# Patient Record
Sex: Male | Born: 1990 | Race: White | Hispanic: No | Marital: Single | Smoking: Current every day smoker
Health system: Southern US, Community
[De-identification: ages and names within clinical notes are randomized; demographics above are authoritative.]

---

## 2016-10-18 ENCOUNTER — Emergency Department
Admission: EM | Admit: 2016-10-18 | Discharge: 2016-10-18 | Disposition: A | Discharge status: Home / Self Care | Payer: Self-pay | Attending: Emergency Medicine | Admitting: Emergency Medicine

## 2016-10-18 ENCOUNTER — Encounter: Payer: Self-pay | Admitting: Emergency Medicine

## 2016-10-18 ENCOUNTER — Emergency Department: Payer: Self-pay

## 2016-10-18 DIAGNOSIS — F17200 Nicotine dependence, unspecified, uncomplicated: Secondary | ICD-10-CM | POA: Insufficient documentation

## 2016-10-18 DIAGNOSIS — L03011 Cellulitis of right finger: Secondary | ICD-10-CM | POA: Insufficient documentation

## 2016-10-18 DIAGNOSIS — Z23 Encounter for immunization: Secondary | ICD-10-CM | POA: Insufficient documentation

## 2016-10-18 MED ORDER — SULFAMETHOXAZOLE-TRIMETHOPRIM 800-160 MG PO TABS: 1.0000 | ORAL_TABLET | Freq: Two times a day (BID) | ORAL | 0 refills | Status: DC

## 2016-10-18 MED ORDER — TETANUS-DIPHTH-ACELL PERTUSSIS 5-2.5-18.5 LF-MCG/0.5 IM SUSP
INTRAMUSCULAR | Status: AC
  Filled 2016-10-18: qty 0.5

## 2016-10-18 MED ORDER — LIDOCAINE HCL (PF) 1 % IJ SOLN
5.0000 mL | Freq: Once | INTRAMUSCULAR | Status: DC
  Filled 2016-10-18: qty 5

## 2016-10-18 MED ORDER — TETANUS-DIPHTH-ACELL PERTUSSIS 5-2.5-18.5 LF-MCG/0.5 IM SUSP
0.5000 mL | Freq: Once | INTRAMUSCULAR | Status: AC
  Administered 2016-10-18: 0.5 mL via INTRAMUSCULAR
  Filled 2016-10-18: qty 0.5
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
## 2016-10-18 NOTE — ED Notes (Signed)
Supplies at bedside for Dr Derrill Kay

## 2016-10-18 NOTE — ED Provider Notes (Signed)
[facility] Emergency Department Provider Note  ____________________________________________   I have reviewed the triage vital signs and the nursing notes.   HISTORY  Chief Complaint Finger Injury   History limited by: Not Limited   HPI Eric Bell is a 26 y.o. male who presents to the emergency department today because of concerns for right fifth digit pain and swelling. States 3 days ago he injured the tip of his right finger. Since that time it has increased in swelling and pain. The pain does sometimes radiate up his hand and arm. The patient does bite his nails. He denies any fevers, nausea or vomiting.   History reviewed. No pertinent past medical history.  There are no active problems to display for this patient.   History reviewed. No pertinent surgical history.  Prior to Admission medications   Not on File    Allergies Patient has no known allergies.  No family history on file.  Social History Social History  Substance Use Topics  . Smoking status: Current Every Day Smoker    Packs/day: 0.50  . Smokeless tobacco: Never Used  . Alcohol use Yes    Review of Systems  Constitutional: Negative for fever. Cardiovascular: Negative for chest pain. Respiratory: Negative for shortness of breath. Gastrointestinal: Negative for abdominal pain, vomiting and diarrhea. Genitourinary: Negative for dysuria. Musculoskeletal: Positive for right fifth digit pain and swelling. Skin: Swelling to right fifth digit.  Neurological: Negative for headaches, focal weakness or numbness.  10-point ROS otherwise negative.  ____________________________________________   PHYSICAL EXAM:  VITAL SIGNS: ED Triage Vitals  Enc Vitals Group     BP 10/18/16 0310 125/72     Pulse Rate 10/18/16 0310 93     Resp 10/18/16 0310 16     Temp 10/18/16 0310 97.9 F (36.6 C)     Temp Source 10/18/16 0310 Oral     SpO2 10/18/16 0310 97 %     Weight 10/18/16  0310 140 lb (63.5 kg)     Height 10/18/16 0310  (1.651 m)     Head Circumference --      Peak Flow --      Pain Score 10/18/16 0542 6     Pain Loc --      Pain Edu? --      Excl. in GC? --      Constitutional: Alert and oriented. Well appearing and in no distress. Eyes: Conjunctivae are normal. Normal extraocular movements. ENT   Head: Normocephalic and atraumatic.   Nose: No congestion/rhinnorhea.   Mouth/Throat: Mucous membranes are moist.   Neck: No stridor. Cardiovascular: Normal rate, regular rhythm.  No murmurs, rubs, or gallops.  Respiratory: Normal respiratory effort without tachypnea nor retractions. Breath sounds are clear and equal bilaterally. No wheezes/rales/rhonchi. Genitourinary: Deferred Musculoskeletal: right fifth upper extremity digit with paronychia Neurologic:  Normal speech and language. No gross focal neurologic deficits are appreciated.  Skin:  Skin is warm, dry and intact. No rash noted. Psychiatric: Mood and affect are normal. Speech and behavior are normal. Patient exhibits appropriate insight and judgment.  ____________________________________________    LABS (pertinent positives/negatives)  None  ____________________________________________   EKG  None  ____________________________________________    RADIOLOGY  Right 5th digit IMPRESSION: Negative for fracture or dislocation. No radiopaque foreign body.  ____________________________________________   PROCEDURES  Procedures  Incision and Drainage of Abcess Location: right fifth upper extremity digit Anesthesia Finger block: 1% lidocaine without epi  Prep/Procedure: Skin Prep: Chlorahex Incised abscess with #  11 blade Purulent discharge: moderate Estimated blood loss: <1 ml    ____________________________________________   INITIAL IMPRESSION / ASSESSMENT AND PLAN / ED COURSE  Pertinent labs & imaging results that were available during my care of the  patient were reviewed by me and considered in my medical decision making (see chart for details).  Patient presented to the emergency department today because of concerns for swelling and pain to the distal aspect of his right fifth digit. He did state that he injured it 3 days. X-ray did not show any osseous injury. On exam he did have a paronychia. This was incised and drained with a moderate amount of pus. Advised patient and gave instuctions to stop finger biting. Discussed return precautions. Will give patient prescription for antibiotics.  ____________________________________________   FINAL CLINICAL IMPRESSION(S) / ED DIAGNOSES  Final diagnoses:  Paronychia of finger of right hand     Note: This dictation was prepared with Dragon dictation. Any transcriptional errors that result from this process are unintentional     Phineas Semen, MD 10/18/16 [phone]

## 2016-10-18 NOTE — Discharge Instructions (Signed)
Please seek medical attention for any high fevers, chest pain, shortness of breath, change in behavior, persistent vomiting, bloody stool or any other new or concerning symptoms.  
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
## 2017-02-04 ENCOUNTER — Emergency Department
Admission: EM | Admit: 2017-02-04 | Discharge: 2017-02-05 | Disposition: A | Discharge status: Home / Self Care | Payer: Self-pay | Attending: Emergency Medicine | Admitting: Emergency Medicine

## 2017-02-04 ENCOUNTER — Emergency Department: Payer: Self-pay

## 2017-02-04 DIAGNOSIS — Y999 Unspecified external cause status: Secondary | ICD-10-CM | POA: Insufficient documentation

## 2017-02-04 DIAGNOSIS — Y939 Activity, unspecified: Secondary | ICD-10-CM | POA: Insufficient documentation

## 2017-02-04 DIAGNOSIS — Z23 Encounter for immunization: Secondary | ICD-10-CM | POA: Insufficient documentation

## 2017-02-04 DIAGNOSIS — F10920 Alcohol use, unspecified with intoxication, uncomplicated: Secondary | ICD-10-CM | POA: Insufficient documentation

## 2017-02-04 DIAGNOSIS — S01112A Laceration without foreign body of left eyelid and periocular area, initial encounter: Secondary | ICD-10-CM | POA: Insufficient documentation

## 2017-02-04 DIAGNOSIS — T40604A Poisoning by unspecified narcotics, undetermined, initial encounter: Secondary | ICD-10-CM | POA: Insufficient documentation

## 2017-02-04 DIAGNOSIS — Y929 Unspecified place or not applicable: Secondary | ICD-10-CM | POA: Insufficient documentation

## 2017-02-04 DIAGNOSIS — S0990XA Unspecified injury of head, initial encounter: Secondary | ICD-10-CM

## 2017-02-04 LAB — COMPREHENSIVE METABOLIC PANEL
ALBUMIN: 3.9 g/dL (ref 3.5–5.0)
ALT: 13 U/L — ABNORMAL LOW (ref 17–63)
ANION GAP: 7 (ref 5–15)
AST: 19 U/L (ref 15–41)
Alkaline Phosphatase: 41 U/L (ref 38–126)
BILIRUBIN TOTAL: 0.9 mg/dL (ref 0.3–1.2)
BUN: 9 mg/dL (ref 6–20)
CHLORIDE: 110 mmol/L (ref 101–111)
CO2: 26 mmol/L (ref 22–32)
Calcium: 8.5 mg/dL — ABNORMAL LOW (ref 8.9–10.3)
Creatinine, Ser: 1.03 mg/dL (ref 0.61–1.24)
GFR calc Af Amer: >60 mL/min (ref >60)
GFR calc non Af Amer: >60 mL/min (ref >60)
GLUCOSE: 95 mg/dL (ref 65–99)
POTASSIUM: 3.3 mmol/L — AB (ref 3.5–5.1)
SODIUM: 143 mmol/L (ref 135–145)
TOTAL PROTEIN: 6.4 g/dL — AB (ref 6.5–8.1)

## 2017-02-04 LAB — ETHANOL: Alcohol, Ethyl (B): 291 mg/dL — ABNORMAL HIGH (ref <5)

## 2017-02-04 LAB — CBC
HEMATOCRIT: 42.4 % (ref 40.0–52.0)
HEMOGLOBIN: 14.9 g/dL (ref 13.0–18.0)
MCH: 31.5 pg (ref 26.0–34.0)
MCHC: 35.1 g/dL (ref 32.0–36.0)
MCV: 89.8 fL (ref 80.0–100.0)
Platelets: 195 10*3/uL (ref 150–440)
RBC: 4.73 MIL/uL (ref 4.40–5.90)
RDW: 12.9 % (ref 11.5–14.5)
WBC: 6.3 10*3/uL (ref 3.8–10.6)

## 2017-02-04 LAB — ACETAMINOPHEN LEVEL

## 2017-02-04 LAB — SALICYLATE LEVEL: Salicylate Lvl: <7 mg/dL (ref 2.8–30.0)

## 2017-02-04 MED ORDER — TETANUS-DIPHTH-ACELL PERTUSSIS 5-2.5-18.5 LF-MCG/0.5 IM SUSP
0.5000 mL | Freq: Once | INTRAMUSCULAR | Status: AC
  Administered 2017-02-04: 0.5 mL via INTRAMUSCULAR
  Filled 2017-02-04: qty 0.5

## 2017-02-04 NOTE — ED Notes (Signed)
Pt asleep at this time, responsive to painful stimuli, pt repositioned to protect airway

## 2017-02-04 NOTE — ED Notes (Signed)
Pt placed on 2L via [place].  
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
## 2017-02-04 NOTE — ED Notes (Signed)
Pt o2sat drop to 81%, o2 increased to 4L

## 2017-02-04 NOTE — ED Notes (Signed)
Per Dr. Pershing ProudSchaevitz, remove c-collar and remove oxygen, continue to monitor
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
## 2017-02-05 LAB — URINE DRUG SCREEN, QUALITATIVE (ARMC ONLY)
Amphetamines, Ur Screen: NOT DETECTED
BARBITURATES, UR SCREEN: NOT DETECTED
Benzodiazepine, Ur Scrn: POSITIVE — AB
CANNABINOID 50 NG, UR ~~LOC~~: POSITIVE — AB
COCAINE METABOLITE, UR ~~LOC~~: NOT DETECTED
MDMA (Ecstasy)Ur Screen: NOT DETECTED
Methadone Scn, Ur: NOT DETECTED
OPIATE, UR SCREEN: NOT DETECTED
PHENCYCLIDINE (PCP) UR S: NOT DETECTED
Tricyclic, Ur Screen: NOT DETECTED

## 2017-02-05 LAB — TROPONIN I: Troponin I: <0.03 ng/mL (ref <0.03)

## 2017-02-05 MED ORDER — HALOPERIDOL LACTATE 5 MG/ML IJ SOLN
5.0000 mg | Freq: Once | INTRAMUSCULAR | Status: AC
  Administered 2017-02-05: 5 mg via INTRAVENOUS

## 2017-02-05 MED ORDER — DIPHENHYDRAMINE HCL 50 MG/ML IJ SOLN
50.0000 mg | Freq: Once | INTRAMUSCULAR | Status: AC
  Administered 2017-02-05: 50 mg via INTRAVENOUS

## 2017-02-05 MED ORDER — DIPHENHYDRAMINE HCL 50 MG/ML IJ SOLN
INTRAMUSCULAR | Status: AC
  Filled 2017-02-05: qty 1

## 2017-02-05 MED ORDER — HALOPERIDOL LACTATE 5 MG/ML IJ SOLN
INTRAMUSCULAR | Status: AC
  Filled 2017-02-05: qty 1

## 2017-02-05 NOTE — ED Notes (Signed)
Patient's wallet placed into his shoe and placed on bench in room.

## 2017-02-05 NOTE — ED Notes (Signed)
Family at bedside. 
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
## 2017-02-05 NOTE — ED Notes (Signed)
Patient is being IVC'd per Dr. Manson PasseyBrown

## 2017-02-05 NOTE — ED Notes (Signed)
Restraints removed by Elmyra RicksMayra, EDT per MD order.

## 2017-02-05 NOTE — Discharge Instructions (Signed)
You were treated the emergency Department for intoxication and substance abuse, as well as seen by a psychiatrist after verbalizing suicidal thoughts, and were recommended for outpatient follow-up. You are referred for a primary care physician, as well as recommended to have behavioral medicine/substance abuse evaluation with a psychiatrist and/or therapist, information for [facility] is provided.  Return to the emergency department immediately for any worsening symptoms including confusion altered mental status, depression or thoughts of wanting to hurt herself or others, or any other symptoms concerning to you.
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
## 2017-02-05 NOTE — ED Notes (Signed)
Enter in error. Delete

## 2017-02-05 NOTE — ED Notes (Signed)
Pt moved to rm 3 to be more visible per Dr. Pershing ProudSchaevitz request
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
## 2017-02-05 NOTE — ED Notes (Addendum)
Informed pt that nephew was going to bring his clothes. Pt requested to leave now in scrub top, shorts and socks.

## 2017-02-05 NOTE — ED Notes (Addendum)
ContactsCarolin Sicks:   Kayla Oustin [phone] (Friend of pt)  Kyra SearlesKrystle Lee [phone] (suppossed sister of pt who lives out of state)

## 2017-03-08 ENCOUNTER — Emergency Department (HOSPITAL_COMMUNITY)
Admission: EM | Admit: 2017-03-08 | Discharge: 2017-03-08 | Disposition: A | Discharge status: Home / Self Care | Payer: Self-pay | Attending: Emergency Medicine | Admitting: Emergency Medicine

## 2017-03-08 ENCOUNTER — Encounter (HOSPITAL_COMMUNITY): Payer: Self-pay

## 2017-03-08 ENCOUNTER — Emergency Department (HOSPITAL_COMMUNITY): Payer: Self-pay

## 2017-03-08 DIAGNOSIS — Z23 Encounter for immunization: Secondary | ICD-10-CM | POA: Insufficient documentation

## 2017-03-08 DIAGNOSIS — Y92239 Unspecified place in hospital as the place of occurrence of the external cause: Secondary | ICD-10-CM | POA: Insufficient documentation

## 2017-03-08 DIAGNOSIS — S62522B Displaced fracture of distal phalanx of left thumb, initial encounter for open fracture: Secondary | ICD-10-CM | POA: Insufficient documentation

## 2017-03-08 DIAGNOSIS — Y999 Unspecified external cause status: Secondary | ICD-10-CM | POA: Insufficient documentation

## 2017-03-08 DIAGNOSIS — Y9389 Activity, other specified: Secondary | ICD-10-CM | POA: Insufficient documentation

## 2017-03-08 DIAGNOSIS — F17200 Nicotine dependence, unspecified, uncomplicated: Secondary | ICD-10-CM | POA: Insufficient documentation

## 2017-03-08 DIAGNOSIS — S62639B Displaced fracture of distal phalanx of unspecified finger, initial encounter for open fracture: Secondary | ICD-10-CM

## 2017-03-08 DIAGNOSIS — W231XXA Caught, crushed, jammed, or pinched between stationary objects, initial encounter: Secondary | ICD-10-CM | POA: Insufficient documentation

## 2017-03-08 MED ORDER — HYDROCODONE-ACETAMINOPHEN 5-325 MG PO TABS: 1.0000 | ORAL_TABLET | Freq: Four times a day (QID) | ORAL | 0 refills | Status: AC | PRN

## 2017-03-08 MED ORDER — TETANUS-DIPHTH-ACELL PERTUSSIS 5-2.5-18.5 LF-MCG/0.5 IM SUSP
0.5000 mL | Freq: Once | INTRAMUSCULAR | Status: AC
  Administered 2017-03-08: 0.5 mL via INTRAMUSCULAR
  Filled 2017-03-08: qty 0.5

## 2017-03-08 MED ORDER — LIDOCAINE HCL (PF) 1 % IJ SOLN
30.0000 mL | Freq: Once | INTRAMUSCULAR | Status: AC
  Administered 2017-03-08: 30 mL
  Filled 2017-03-08: qty 30

## 2017-03-08 MED ORDER — CEPHALEXIN 500 MG PO CAPS: 500.0000 mg | ORAL_CAPSULE | Freq: Two times a day (BID) | ORAL | 0 refills | Status: AC

## 2017-03-08 MED ORDER — OXYCODONE-ACETAMINOPHEN 5-325 MG PO TABS: 1.0000 | ORAL_TABLET | Freq: Once | ORAL | Status: DC

## 2017-03-08 MED ORDER — HYDROMORPHONE HCL 1 MG/ML IJ SOLN
1.0000 mg | Freq: Once | INTRAMUSCULAR | Status: AC
  Administered 2017-03-08: 1 mg via INTRAMUSCULAR
  Filled 2017-03-08: qty 1
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
## 2017-03-08 NOTE — ED Notes (Signed)
Pinky finger wrapped in sterile soaked gauze

## 2017-03-08 NOTE — ED Triage Notes (Signed)
Pt endorses having a door closed on his left pinky finger. Tip of finger is severed nearly entirely through. Finger is wrapped at this time and bleeding controlled. VSS.

## 2017-03-15 ENCOUNTER — Emergency Department
Admission: EM | Admit: 2017-03-15 | Discharge: 2017-03-15 | Discharge status: Against Medical Advice | Payer: Self-pay | Attending: Emergency Medicine | Admitting: Emergency Medicine

## 2017-03-15 ENCOUNTER — Encounter: Payer: Self-pay | Admitting: Emergency Medicine

## 2017-03-15 ENCOUNTER — Emergency Department: Payer: Self-pay

## 2017-03-15 DIAGNOSIS — F17200 Nicotine dependence, unspecified, uncomplicated: Secondary | ICD-10-CM | POA: Insufficient documentation

## 2017-03-15 DIAGNOSIS — Z5320 Procedure and treatment not carried out because of patient's decision for unspecified reasons: Secondary | ICD-10-CM | POA: Insufficient documentation

## 2017-03-15 DIAGNOSIS — M65142 Other infective (teno)synovitis, left hand: Secondary | ICD-10-CM | POA: Insufficient documentation

## 2017-03-15 LAB — CBC WITH DIFFERENTIAL/PLATELET
Basophils Absolute: 0.1 10*3/uL (ref 0–0.1)
Basophils Relative: 1 %
Eosinophils Absolute: 0.1 10*3/uL (ref 0–0.7)
Eosinophils Relative: 2 %
HCT: 46.7 % (ref 40.0–52.0)
Hemoglobin: 15.8 g/dL (ref 13.0–18.0)
Lymphocytes Relative: 23 %
Lymphs Abs: 1.8 10*3/uL (ref 1.0–3.6)
MCH: 30.8 pg (ref 26.0–34.0)
MCHC: 33.9 g/dL (ref 32.0–36.0)
MCV: 90.9 fL (ref 80.0–100.0)
Monocytes Absolute: 0.6 10*3/uL (ref 0.2–1.0)
Monocytes Relative: 8 %
Neutro Abs: 5.3 10*3/uL (ref 1.4–6.5)
Neutrophils Relative %: 66 %
Platelets: 197 10*3/uL (ref 150–440)
RBC: 5.14 MIL/uL (ref 4.40–5.90)
RDW: 13.1 % (ref 11.5–14.5)
WBC: 7.8 10*3/uL (ref 3.8–10.6)

## 2017-03-15 LAB — COMPREHENSIVE METABOLIC PANEL
ALT: 16 U/L — ABNORMAL LOW (ref 17–63)
AST: 17 U/L (ref 15–41)
Albumin: 4.6 g/dL (ref 3.5–5.0)
Alkaline Phosphatase: 66 U/L (ref 38–126)
Anion gap: 9 (ref 5–15)
BUN: 12 mg/dL (ref 6–20)
CO2: 28 mmol/L (ref 22–32)
Calcium: 9.7 mg/dL (ref 8.9–10.3)
Chloride: 100 mmol/L — ABNORMAL LOW (ref 101–111)
Creatinine, Ser: 0.92 mg/dL (ref 0.61–1.24)
GFR calc Af Amer: >60 mL/min (ref >60)
GFR calc non Af Amer: >60 mL/min (ref >60)
Glucose, Bld: 91 mg/dL (ref 65–99)
Potassium: 3.9 mmol/L (ref 3.5–5.1)
Sodium: 137 mmol/L (ref 135–145)
Total Bilirubin: 0.6 mg/dL (ref 0.3–1.2)
Total Protein: 7.7 g/dL (ref 6.5–8.1)

## 2017-03-15 LAB — SEDIMENTATION RATE: Sed Rate: 2 mm/hr (ref 0–15)

## 2017-03-15 LAB — C-REACTIVE PROTEIN: CRP: <0.8 mg/dL (ref <1.0)

## 2017-03-15 MED ORDER — ONDANSETRON 4 MG PO TBDP: 4.0000 mg | ORAL_TABLET | Freq: Once | ORAL | Status: DC

## 2017-03-15 MED ORDER — SULFAMETHOXAZOLE-TRIMETHOPRIM 800-160 MG PO TABS: 1.0000 | ORAL_TABLET | Freq: Once | ORAL | Status: DC

## 2017-03-15 MED ORDER — CLINDAMYCIN HCL 300 MG PO CAPS: 300.0000 mg | ORAL_CAPSULE | Freq: Four times a day (QID) | ORAL | 0 refills | Status: AC

## 2017-03-15 MED ORDER — CLINDAMYCIN PHOSPHATE 600 MG/50ML IV SOLN: 600.0000 mg | Freq: Once | INTRAVENOUS | Status: DC

## 2017-03-15 MED ORDER — SULFAMETHOXAZOLE-TRIMETHOPRIM 800-160 MG PO TABS: 1.0000 | ORAL_TABLET | Freq: Two times a day (BID) | ORAL | 0 refills | Status: AC

## 2017-03-15 NOTE — ED Notes (Signed)
Pt refusing to be transferred, stating he needs to go home. Jackie notified. Pt agreed to wait a bit longer. Pt given ascom to make phone call.

## 2017-03-15 NOTE — ED Notes (Signed)
Pt had stitches placed in right hand 5th digit X 5 days ago. noticed redness and blackening of skin approx 2 days ago. Fingertip black at this time of assessment.

## 2017-03-15 NOTE — ED Triage Notes (Signed)
Left fifth finger with stitches intact, wound well approximated. +1 swelling seen to finger.  No redness seen.

## 2017-03-15 NOTE — ED Provider Notes (Signed)
[facility] Emergency Department Provider Note  ____________________________________________  Time seen: Approximately 5:28 PM  I have reviewed the triage vital signs and the nursing notes.   HISTORY  Chief Complaint Wound Check    HPI Eric Bell is a 26 y.o. male presents to the emergency department with left fifth digit pain. Patient states that one week ago, his left fifth digit was slammed in a door at [employer]. Patient sustained a distal tuft fracture and complex laceration. Patient states that he underwent laceration repair in the emergency department by Dr. Izora Ribas and was discharged with Keflex. Patient states that he has been taking Keflex as directed. Patient states that two days ago, he started to have difficulty keeping left fifth digit "straight". Patient has pain with extension of left fifth digit. Patient has pain with palpation along the flexor tendon. He has also noticed edema and erythema along laceration site. He denies fever and chills. No alleviating measures have been attempted.    History reviewed. No pertinent past medical history.  There are no active problems to display for this patient.   History reviewed. No pertinent surgical history.  Prior to Admission medications   Medication Sig Start Date End Date Taking? Authorizing Provider  cephALEXin (KEFLEX) 500 MG capsule Take 1 capsule (500 mg total) by mouth 2 (two) times daily. 03/08/17   Roxy Horseman, PA-C  clindamycin (CLEOCIN) 300 MG capsule Take 1 capsule (300 mg total) by mouth 4 (four) times daily. 03/15/17 03/25/17  Orvil Feil, PA-C  HYDROcodone-acetaminophen (NORCO/VICODIN) 5-325 MG tablet Take 1-2 tablets by mouth every 6 (six) hours as needed. 03/08/17   Roxy Horseman, PA-C  sulfamethoxazole-trimethoprim (BACTRIM DS,SEPTRA DS) 800-160 MG tablet Take 1 tablet by mouth 2 (two) times daily. 03/15/17 03/25/17  Orvil Feil, PA-C    Allergies Patient has no known  allergies.  No family history on file.  Social History Social History  Substance Use Topics  . Smoking status: Current Every Day Smoker    Packs/day: 0.50  . Smokeless tobacco: Never Used  . Alcohol use Yes     Review of Systems  Constitutional: No fever/chills Eyes: No visual changes. No discharge ENT: No upper respiratory complaints. Cardiovascular: no chest pain. Respiratory: no cough. No SOB. Gastrointestinal: No abdominal pain.  No nausea, no vomiting.  No diarrhea.  No constipation. Musculoskeletal: Patient has left fifth digit pain. Skin: Negative for rash, abrasions, lacerations, ecchymosis. Neurological: Negative for headaches, focal weakness or numbness.   ____________________________________________   PHYSICAL EXAM:  VITAL SIGNS: ED Triage Vitals  Enc Vitals Group     BP 03/15/17 1637 125/79     Pulse Rate 03/15/17 1637 72     Resp 03/15/17 1637 16     Temp 03/15/17 1637 97.8 F (36.6 C)     Temp Source 03/15/17 1637 Oral     SpO2 03/15/17 1637 98 %     Weight 03/15/17 1638 150 lb (68 kg)     Height 03/15/17 1638 5' 5.5" (1.664 m)     Head Circumference --      Peak Flow --      Pain Score 03/15/17 1637 7     Pain Loc --      Pain Edu? --      Excl. in GC? --      Constitutional: Alert and oriented. Well appearing and in no acute distress. Eyes: Conjunctivae are normal. PERRL. EOMI. Head: Atraumatic. Cardiovascular: Normal rate, regular rhythm. Normal S1  and S2.  Good peripheral circulation. Respiratory: Normal respiratory effort without tachypnea or retractions. Lungs CTAB. Good air entry to the bases with no decreased or absent breath sounds. Musculoskeletal: Patient is able to move all 5 left fingers. Patient has significant pain with passive extension at the left fifth digit. Left fifth digit is held in 20 of flexion. From the DIP to the tip of the finger there is black callous formation visualized. Patient has edema and erythema surrounding  laceration site. Palpable radial pulse, left. Neurologic:  Patient has diminished sensation along left fifth digit. Psychiatric: Mood and affect are normal. Speech and behavior are normal. Patient exhibits appropriate insight and judgement.   ____________________________________________   LABS (all labs ordered are listed, but only abnormal results are displayed)  Labs Reviewed  COMPREHENSIVE METABOLIC PANEL - Abnormal; Notable for the following:       Result Value   Chloride 100 (*)    ALT 16 (*)    All other components within normal limits  CULTURE, BLOOD (ROUTINE X 2)  CULTURE, BLOOD (ROUTINE X 2)  CBC WITH DIFFERENTIAL/PLATELET  SEDIMENTATION RATE  C-REACTIVE PROTEIN   ____________________________________________  EKG   ____________________________________________  RADIOLOGY Geraldo Pitter, personally viewed and evaluated these images (plain radiographs) as part of my medical decision making, as well as reviewing the written report by the radiologist.    Dg Hand Complete Left  Result Date: 03/15/2017 CLINICAL DATA:  26 y/o  M; crush injury of the distal fifth digit. EXAM: LEFT HAND - COMPLETE 3+ VIEW COMPARISON:  None. FINDINGS: Right fifth distal phalanx transverse tuft fracture with 1/2 shaft's with volar displacement of the distal fracture component. No other fracture or dislocation identified. Soft tissue swelling of the fifth digit. No bony erosive changes. IMPRESSION: Right fifth distal phalanx mildly displaced transverse tuft fracture. No bony erosive changes. Electronically Signed   By: Mitzi Hansen M.D.   On: 03/15/2017 18:03    ____________________________________________    PROCEDURES  Procedure(s) performed:    Procedures    Medications  ondansetron (ZOFRAN-ODT) disintegrating tablet 4 mg (not administered)  clindamycin (CLEOCIN) IVPB 600 mg (not administered)  sulfamethoxazole-trimethoprim (BACTRIM DS,SEPTRA DS) 800-160 MG per  tablet 1 tablet (not administered)     ____________________________________________   INITIAL IMPRESSION / ASSESSMENT AND PLAN / ED COURSE  Pertinent labs & imaging results that were available during my care of the patient were reviewed by me and considered in my medical decision making (see chart for details).  Review of the [place] CSRS was performed in accordance of the [facility] prior to dispensing any controlled drugs.     Assessment and plan Flexor tenosynovitis Patient presents to the emergency department with left fifth digit pain after sustaining an open distal tuft fracture 1 week ago. Physical exam findings are concerning for flexor tenosynovitis. After initial workup, patient stated that he could not stay for continued care. Patient states that his girlfriend works until 6:00 in the morning and he would seek care then. I offered to provide a work note for patient's girlfriend. Patient once again refused to seek continued care. Patient was told 4 times during this emergency department encounter regarding the risks of not seeking immediate medical attention for flexor tenosynovitis such as the loss of the left fifth digit, the left hand or possibly the left upper extremity. Patient voiced understanding. I contacted Dr. Martha Clan, the orthopedist on-call. Dr. Martha Clan advised starting patient on both Bactrim and clindamycin and once again reinforced the ramifications  of not seeking care. Patient left the emergency department AGAINST MEDICAL ADVICE. He was advised to seek care at [facility] or [facility], per the recommendation of Dr. Martha Clan. All patient questions were answered.   ____________________________________________  FINAL CLINICAL IMPRESSION(S) / ED DIAGNOSES  Final diagnoses:  Suppurative tenosynovitis of flexor tendon of left hand      NEW MEDICATIONS STARTED DURING THIS VISIT:  New Prescriptions   CLINDAMYCIN (CLEOCIN) 300 MG CAPSULE    Take 1 capsule (300 mg  total) by mouth 4 (four) times daily.   SULFAMETHOXAZOLE-TRIMETHOPRIM (BACTRIM DS,SEPTRA DS) 800-160 MG TABLET    Take 1 tablet by mouth 2 (two) times daily.        This chart was dictated using voice recognition software/Dragon. Despite best efforts to proofread, errors can occur which can change the meaning. Any change was purely unintentional.    Gasper Lloyd 03/15/17 2008    Merrily Brittle, MD 03/15/17 2220

## 2017-03-15 NOTE — ED Notes (Signed)
Pt requesting to wait on the zofran medication at this time

## 2017-03-15 NOTE — ED Triage Notes (Signed)
Finger injury 5-6 days ago while at [facility].  Seen through ED and stitches placed.  Patient has been taking antibiotics, keflex 500 mg BID.  States pain and swelling worse.

## 2017-03-15 NOTE — Consult Note (Signed)
Called by Vallarie Mare, PA in the ER about this 26 y/o male who was seen at [facility] last week with an open distal phalanx fracture.   He underwent laceration repair of the left small finger by Dr. Lenon Curt, a hand surgeon. Patient was sent home on keflex and he reports compliance with the antibiotics.   He presents to [facility] today with a complaint that he is having difficulty keeping the small finger "straight".  He complains of pain with passive extension to his left small finger.  Ms. Sherral Hammers states there is erythema at the laceration, but has no fever or chills.   WBC count is 7.8 without left shift and ESR is 2.  Patient is signing out of the ER AMA as he states there is no one to watch his daughter.   I recommended patient be given prescriptions for clindamycin and bactrim.   He should follow up with a hand specialist for possible developing flexor tenosynovitis vs. Possible soft tissue mallet finger.  Patient has been informed of the risks of leaving AMA by the ER staff including the spread of infection that may lead to loss of his digit or sepsis.

## 2017-03-15 NOTE — ED Notes (Signed)
Pt refusing any further care. Provider is aware and has spoken with pt regarding situation. Pt has refused all medications ordered at this time. Pt being discharge and is leaving AMA

## 2017-03-16 ENCOUNTER — Emergency Department (HOSPITAL_COMMUNITY): Payer: Self-pay

## 2017-03-16 ENCOUNTER — Encounter (HOSPITAL_COMMUNITY): Payer: Self-pay | Admitting: Emergency Medicine

## 2017-03-16 ENCOUNTER — Emergency Department (HOSPITAL_COMMUNITY)
Admission: EM | Admit: 2017-03-16 | Discharge: 2017-03-16 | Disposition: A | Discharge status: Home / Self Care | Payer: Self-pay | Attending: Emergency Medicine | Admitting: Emergency Medicine

## 2017-03-16 DIAGNOSIS — F17200 Nicotine dependence, unspecified, uncomplicated: Secondary | ICD-10-CM | POA: Insufficient documentation

## 2017-03-16 DIAGNOSIS — T148XXA Other injury of unspecified body region, initial encounter: Secondary | ICD-10-CM

## 2017-03-16 DIAGNOSIS — L089 Local infection of the skin and subcutaneous tissue, unspecified: Secondary | ICD-10-CM | POA: Insufficient documentation

## 2017-03-16 NOTE — ED Provider Notes (Signed)
[facility]-EMERGENCY DEPT Provider Note   CSN: [id number] Arrival date & time: 03/16/17  1144     History   Chief Complaint Chief Complaint  Patient presents with  . Hand Injury    HPI Eric Bell is a 26 y.o. male.  HPI    26 year old male presents today with complaints of finger infection.  Patient notes that on 03/08/17 he slammed his finger in a door.  He was seen in the emergency room, consultation by Dr. Izora Ribasoley of hand surgery who repaired his injury.  Patient suffered a displaced transverse tuft fracture at that time.  Patient was placed on Keflex, notes that he started at the day after, and has been taking it for 8 days now.  Patient notes that he was seen at [facility] last night, they recommended [facility] be [facility] management of this infected digit.  Patient notes he was prescribed both Bactrim and clindamycin encouraged to start taking this immediately.  Patient reports that they prescriptions were too expensive and he did not take them.  Patient notes pain at the distal left fifth digit with swelling, and decreased range of motion.    History reviewed. No pertinent past medical history.  There are no active problems to display for this patient.   History reviewed. No pertinent surgical history.     Home Medications    Prior to Admission medications   Medication Sig Start Date End Date Taking? Authorizing Provider  cephALEXin (KEFLEX) 500 MG capsule Take 1 capsule (500 mg total) by mouth 2 (two) times daily. 03/08/17   Roxy HorsemanBrowning, Robert, PA-C  clindamycin (CLEOCIN) 300 MG capsule Take 1 capsule (300 mg total) by mouth 4 (four) times daily. 03/15/17 03/25/17  Orvil FeilWoods, Jaclyn M, PA-C  HYDROcodone-acetaminophen (NORCO/VICODIN) 5-325 MG tablet Take 1-2 tablets by mouth every 6 (six) hours as needed. 03/08/17   Roxy HorsemanBrowning, Robert, PA-C  sulfamethoxazole-trimethoprim (BACTRIM DS,SEPTRA DS) 800-160 MG tablet Take 1 tablet by mouth 2 (two) times daily. 03/15/17 03/25/17   Orvil FeilWoods, Jaclyn M, PA-C    Family History No family history on file.  Social History Social History  Substance Use Topics  . Smoking status: Current Every Day Smoker    Packs/day: 0.50  . Smokeless tobacco: Never Used  . Alcohol use Yes     Allergies   Patient has no known allergies.   Review of Systems Review of Systems  All other systems reviewed and are negative.   Physical Exam Updated Vital Signs BP 127/76 (BP Location: Right Arm)   Pulse 100   Temp 98.2 F (36.8 C) (Oral)   Resp 20   SpO2 99%   Physical Exam  Constitutional: He is oriented to person, place, and time. He appears well-developed and well-nourished.  HENT:  Head: Normocephalic and atraumatic.  Eyes: Pupils are equal, round, and reactive to light. Conjunctivae are normal. Right eye exhibits no discharge. Left eye exhibits no discharge. No scleral icterus.  Neck: Normal range of motion. No JVD present. No tracheal deviation present.  Pulmonary/Chest: Effort normal. No stridor.  Neurological: He is alert and oriented to person, place, and time. Coordination normal.  Psychiatric: He has a normal mood and affect. His behavior is normal. Judgment and thought content normal.  Nursing note and vitals reviewed.          ED Treatments / Results  Labs (all labs ordered are listed, but only abnormal results are displayed) Labs Reviewed - No data to display  EKG  EKG Interpretation  None       Radiology Dg Hand Complete Left  Result Date: 03/15/2017 CLINICAL DATA:  26 y/o  M; crush injury of the distal fifth digit. EXAM: LEFT HAND - COMPLETE 3+ VIEW COMPARISON:  None. FINDINGS: Right fifth distal phalanx transverse tuft fracture with 1/2 shaft's with volar displacement of the distal fracture component. No other fracture or dislocation identified. Soft tissue swelling of the fifth digit. No bony erosive changes. IMPRESSION: Right fifth distal phalanx mildly displaced transverse tuft fracture. No  bony erosive changes. Electronically Signed   By: Mitzi Hansen M.D.   On: 03/15/2017 18:03   Dg Finger Little Left  Result Date: 03/16/2017 CLINICAL DATA:  SWELLING,REDNESS TO LEFT LITTLE FINGER DISTALLY .INJURY 1 WEEK AGO WITH LACERATION DISTAL PHALANX EXAM: LEFT LITTLE FINGER 2+V COMPARISON:  03/08/2017 FINDINGS: There is significant soft tissue swelling of the fifth digit. There is anterior displacement of the distal tuft of the distal phalanx. No radiopaque foreign body. IMPRESSION: 1. Soft tissue swelling. 2. Displaced tuft fracture Electronically Signed   By: Norva Pavlov M.D.   On: 03/16/2017 14:00    Procedures Procedures (including critical care time)  Medications Ordered in ED Medications - No data to display   Initial Impression / Assessment and Plan / ED Course  I have reviewed the triage vital signs and the nursing notes.  Pertinent labs & imaging results that were available during my care of the patient were reviewed by me and considered in my medical decision making (see chart for details).      Final Clinical Impressions(s) / ED Diagnoses   Final diagnoses:  Finger infection  Open fracture    Consults: Dr. Izora Ribas  Assessment/Plan: 26 year old male presents today with infection of his distal finger.  Patient has an open fracture.  Dr. Izora Ribas originally saw the patient here in the ED, patient was supposed to follow-up as an outpatient but did not.  I consulted Dr. Debby Bud office, I spoke with his nurse practitioner.  Dr. Izora Ribas called back instructing to have patient call his office to schedule an appointment as previously instructed.  Patient will be discharged, he will follow-up at Dr. Debby Bud office, he will follow up with [facility] for assistant with his prescriptions.  I counseled patient on the urgency of following up and taking antibiotics as directed.  Patient is given strict return precautions, he verbalized understanding and agreement  to today's plan had no further questions or concerns at time of discharge    New Prescriptions New Prescriptions   No medications on file     Eyvonne Mechanic, Cordelia Poche 03/16/17 1449    Vanetta Mulders, MD 03/16/17 [phone]

## 2017-03-16 NOTE — ED Triage Notes (Signed)
SEEN [facility] N[facility] FOR FINGER INJURY AND CANNOT AFFORD MEDS,  LEFT LITTLE FINGER is crusty with dried blood and  Has sutures in it red and swollen

## 2017-03-16 NOTE — ED Notes (Signed)
Pt back from x-ray.

## 2017-03-16 NOTE — Discharge Instructions (Signed)
Please read attached information. If you experience any new or worsening signs or symptoms please return to the emergency room for evaluation. Please follow-up with  as discussed.  Please see medication previously prescribed

## 2017-03-17 NOTE — ED Provider Notes (Signed)
Patient lost his clindamycin and Bactrim prescriptions rewritten and Bactrim DS one twice a day for 10 days and clindamycin 301 3 times a day for 10 days.   Arnaldo NatalMalinda, Keara Pagliarulo F, MD 03/17/17 [phone]

## 2017-03-20 LAB — CULTURE, BLOOD (ROUTINE X 2)
Culture: NO GROWTH
Culture: NO GROWTH
Special Requests: ADEQUATE
Special Requests: ADEQUATE

## 2017-03-23 ENCOUNTER — Encounter: Payer: Self-pay | Admitting: *Deleted

## 2017-03-23 DIAGNOSIS — Z4802 Encounter for removal of sutures: Secondary | ICD-10-CM | POA: Insufficient documentation

## 2017-03-23 DIAGNOSIS — Z5321 Procedure and treatment not carried out due to patient leaving prior to being seen by health care provider: Secondary | ICD-10-CM | POA: Insufficient documentation

## 2017-03-23 NOTE — ED Triage Notes (Signed)
Pt here for suture removal of left 5th finger.

## 2017-03-24 ENCOUNTER — Encounter: Payer: Self-pay | Admitting: Emergency Medicine

## 2017-03-24 ENCOUNTER — Emergency Department
Admission: EM | Admit: 2017-03-24 | Discharge: 2017-03-24 | Disposition: A | Discharge status: Home / Self Care | Payer: Self-pay | Attending: Emergency Medicine | Admitting: Emergency Medicine

## 2017-03-24 DIAGNOSIS — Z79899 Other long term (current) drug therapy: Secondary | ICD-10-CM | POA: Insufficient documentation

## 2017-03-24 DIAGNOSIS — Z4802 Encounter for removal of sutures: Secondary | ICD-10-CM

## 2017-03-24 DIAGNOSIS — X58XXXD Exposure to other specified factors, subsequent encounter: Secondary | ICD-10-CM | POA: Insufficient documentation

## 2017-03-24 DIAGNOSIS — F17200 Nicotine dependence, unspecified, uncomplicated: Secondary | ICD-10-CM | POA: Insufficient documentation

## 2017-03-24 DIAGNOSIS — S61216D Laceration without foreign body of right little finger without damage to nail, subsequent encounter: Secondary | ICD-10-CM | POA: Insufficient documentation

## 2017-03-24 NOTE — ED Notes (Signed)
Pt states he has had stitches in L pinky finger since 8/21. States "I know it's passed due but it got infected and the hand surgeon told me to keep them in longer until the infection was gone." pt arrives with L pinky finger wrapped. Alert, oriented, ambulatory.

## 2017-03-24 NOTE — ED Triage Notes (Signed)
Pt comes to ED for suture removal of left pinky finger, sutures placed 8/21

## 2017-03-24 NOTE — Discharge Instructions (Signed)
Continue to monitor wound area. If you note signs of developing infection do not hesitate to return to the emergency department.

## 2017-03-24 NOTE — ED Provider Notes (Signed)
[facility] Emergency Department Provider Note   ____________________________________________   I have reviewed the triage vital signs and the nursing notes.   HISTORY  Chief Complaint Suture / Staple Removal    HPI Eric Bell is a 26 y.o. male presents to emergency department for suture removal of the right fifth digit. Patient had sutures placed on 03/08/2017. Patient had a delay and removal secondary to infection that developed in the wound. Patient reports the infection resolved and the hand surgeon reportedly could have the sutures removed yesterday or today. Patient denies any other complaints or problems. Patient denies fever, chills, headache, vision changes, chest pain, chest tightness, shortness of breath, abdominal pain, nausea and vomiting.  History reviewed. No pertinent past medical history.  There are no active problems to display for this patient.   History reviewed. No pertinent surgical history.  Prior to Admission medications   Medication Sig Start Date End Date Taking? Authorizing Provider  cephALEXin (KEFLEX) 500 MG capsule Take 1 capsule (500 mg total) by mouth 2 (two) times daily. 03/08/17   Roxy Horseman, PA-C  clindamycin (CLEOCIN) 300 MG capsule Take 1 capsule (300 mg total) by mouth 4 (four) times daily. 03/15/17 03/25/17  Orvil Feil, PA-C  HYDROcodone-acetaminophen (NORCO/VICODIN) 5-325 MG tablet Take 1-2 tablets by mouth every 6 (six) hours as needed. 03/08/17   Roxy Horseman, PA-C  sulfamethoxazole-trimethoprim (BACTRIM DS,SEPTRA DS) 800-160 MG tablet Take 1 tablet by mouth 2 (two) times daily. 03/15/17 03/25/17  Orvil Feil, PA-C    Allergies Patient has no known allergies.  History reviewed. No pertinent family history.  Social History Social History  Substance Use Topics  . Smoking status: Current Every Day Smoker    Packs/day: 0.50  . Smokeless tobacco: Never Used  . Alcohol use Yes    Review of  Systems Constitutional: Negative for fever/chills Eyes: No visual changes. ENT:  Negative for sore throat and for difficulty swallowing Cardiovascular: Denies chest pain. Respiratory: Denies cough. Denies shortness of breath. Gastrointestinal: No abdominal pain.  No nausea, vomiting, diarrhea. Genitourinary: Negative for dysuria. Musculoskeletal: Negative for back pain. Skin: Negative for rash. Left fifth digit healed incision with 5 sutures.  Neurological: Negative for headaches.  . Able to ambulate. ____________________________________________   PHYSICAL EXAM:  VITAL SIGNS: ED Triage Vitals [03/24/17 1248]  Enc Vitals Group     BP 117/82     Pulse Rate 64     Resp 16     Temp 97.6 F (36.4 C)     Temp Source Oral     SpO2 98 %     Weight 150 lb (68 kg)     Height  (1.651 m)     Head Circumference      Peak Flow      Pain Score 0     Pain Loc      Pain Edu?      Excl. in GC?     Constitutional: Alert and oriented. Well appearing and in no acute distress.  Eyes: Conjunctivae are normal. PERRL. EOMI  Head: Normocephalic and atraumatic. Neck:Supple. Cardiovascular: Normal rate, regular rhythm.  Good peripheral circulation. Respiratory: Normal respiratory effort without tachypnea or retractions. Lungs CTAB.  Hematological/Lymphatic/Immunological: No cervical lymphadenopathy. Cardiovascular: Normal rate, regular rhythm. Normal distal pulses. Musculoskeletal: Nontender with normal range of motion in all extremities. Neurologic: Normal speech and language.  Skin:  Skin is warm, dry and intact. No rash noted. Psychiatric: Mood and affect are normal. Speech  and behavior are normal. Patient exhibits appropriate insight and judgement.  ____________________________________________   LABS (all labs ordered are listed, but only abnormal results are displayed)  Labs Reviewed - No data to  display ____________________________________________  EKG none ____________________________________________  RADIOLOGY none ____________________________________________   PROCEDURES  Procedure(s) performed: SUTURE REMOVAL Performed by: Clois Comberraci M Little  Consent: Verbal consent obtained. Patient identity confirmed: provided demographic data Time out: Immediately prior to procedure a "time out" was called to verify the correct patient, procedure, equipment, support staff and site/side marked as required.  Location details: right fifth digit, distal phalanx  Wound Appearance: clean  Sutures/Staples Removed: (5) sutures.  Facility: sutures placed in this facility Patient tolerance: Patient tolerated the procedure well with no immediate complications.     Critical Care performed: no ____________________________________________   INITIAL IMPRESSION / ASSESSMENT AND PLAN / ED COURSE  Pertinent labs & imaging results that were available during my care of the patient were reviewed by me and considered in my medical decision making (see chart for details).  Patient presents to emergency department suture removal. See procedure note above. No complication noted. Patient advised to follow up with hand surgeon as needed or return to the emergency department if he noted signs of infection.  Patient informed of clinical course, understand medical decision-making process, and agree with plan.  ____________________________________________   FINAL CLINICAL IMPRESSION(S) / ED DIAGNOSES  Final diagnoses:  Visit for suture removal       NEW MEDICATIONS STARTED DURING THIS VISIT:  Discharge Medication List as of 03/24/2017  1:18 PM       Note:  This document was prepared using Dragon voice recognition software and may include unintentional dictation errors.   Clois ComberLittle, Traci M, PA-C 03/24/17 1423    Sharman CheekStafford, Phillip, MD 03/28/17 [phone]
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
